# Patient Record
Sex: Male | Born: 1980 | Race: White | Hispanic: No | Marital: Single | Smoking: Never smoker
Health system: Southern US, Community
[De-identification: ages and names within clinical notes are randomized; demographics above are authoritative.]

## PROBLEM LIST (undated history)

## (undated) DIAGNOSIS — Z22322 Carrier or suspected carrier of Methicillin resistant Staphylococcus aureus: Secondary | ICD-10-CM

## (undated) DIAGNOSIS — J45909 Unspecified asthma, uncomplicated: Secondary | ICD-10-CM

## (undated) HISTORY — DX: Carrier or suspected carrier of methicillin resistant Staphylococcus aureus: Z22.322

## (undated) HISTORY — DX: Unspecified asthma, uncomplicated: J45.909

---
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
## 2004-09-14 ENCOUNTER — Emergency Department: Payer: Self-pay | Admitting: Emergency Medicine

## 2007-09-15 ENCOUNTER — Emergency Department: Payer: Self-pay | Admitting: Emergency Medicine

## 2008-11-21 IMAGING — CR LEFT THUMB 2+V
1 series · 3 of 3 positions shown · non-contrast
Comparison: none

REASON FOR EXAM: laceration, handsaw, pain
COMMENTS:   LMP: male

PROCEDURE:     DXR - DXR THUMB LEFT HAND (1ST DIGIT)  - September 15, 2007  [DATE]
RESULT:     Three views of the LEFT thumb revealed no evidence of acute
fracture. Disruption of soft tissues near the nail base is suspected. No
radiopaque foreign bodies are seen.

[Series 1: view not recorded · 0.17mm/px · 3 of 3 slices shown]
[im 1/3]
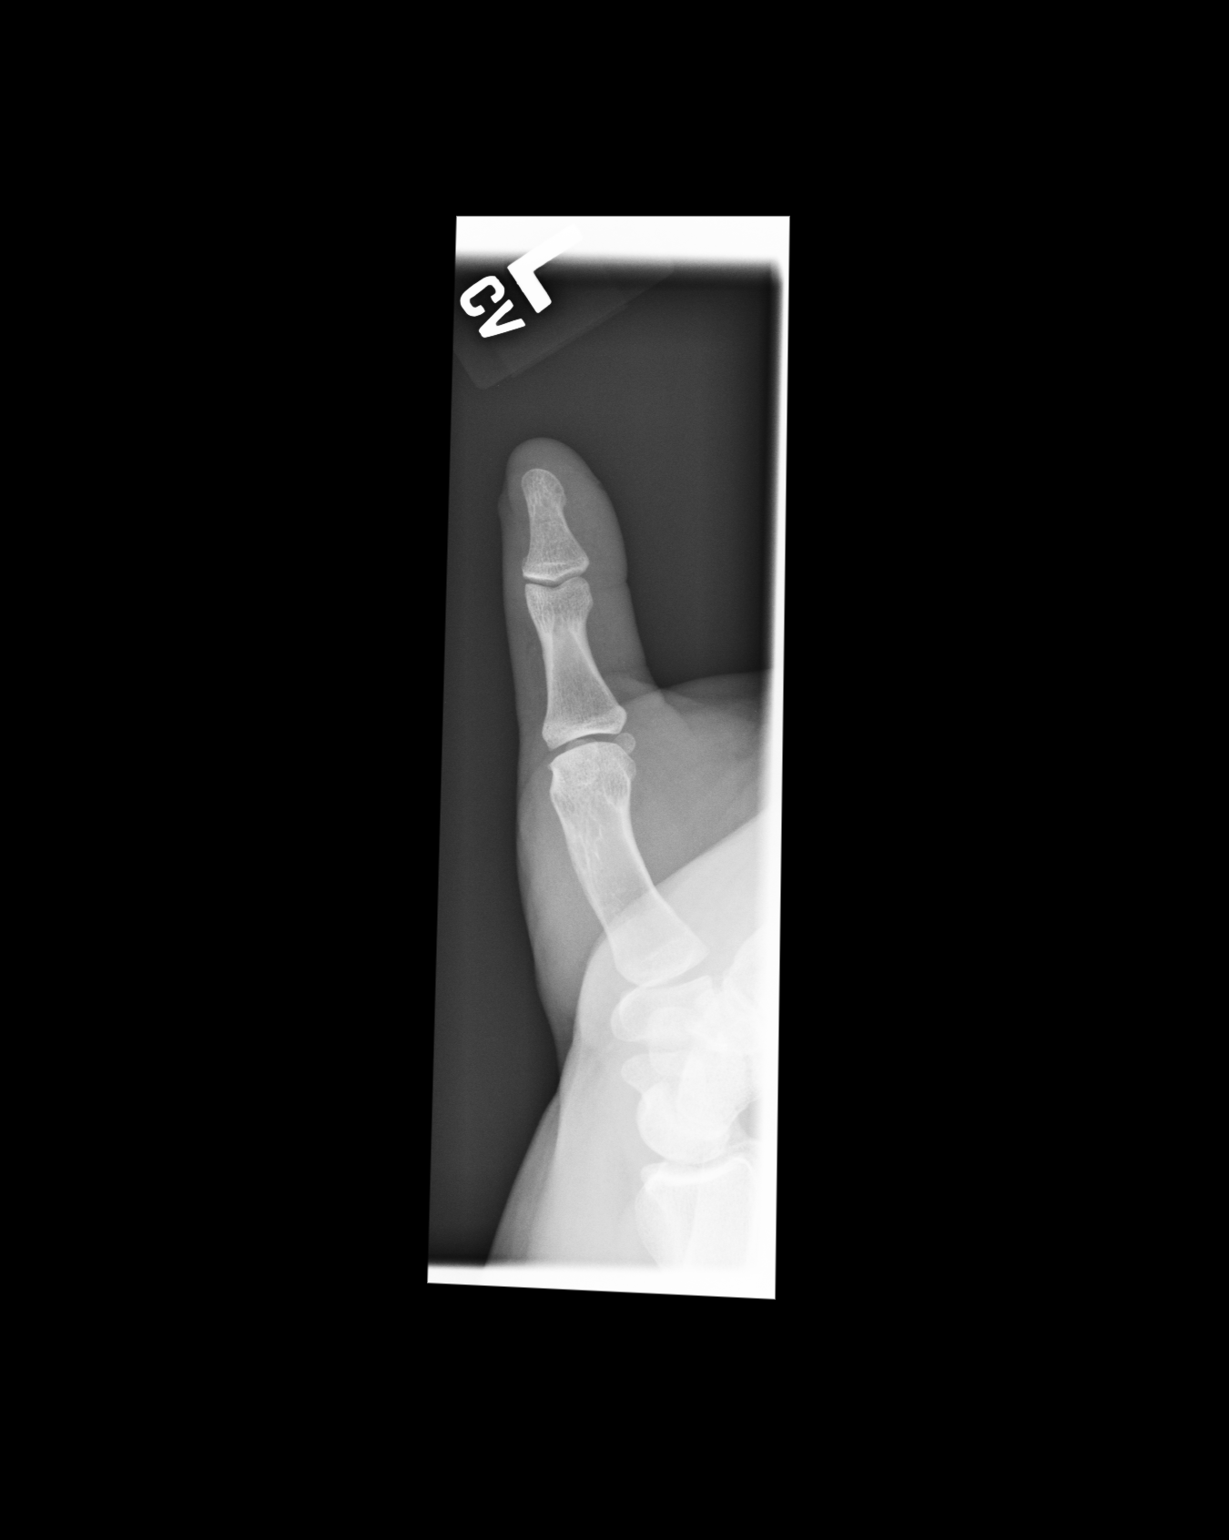
[im 2/3]
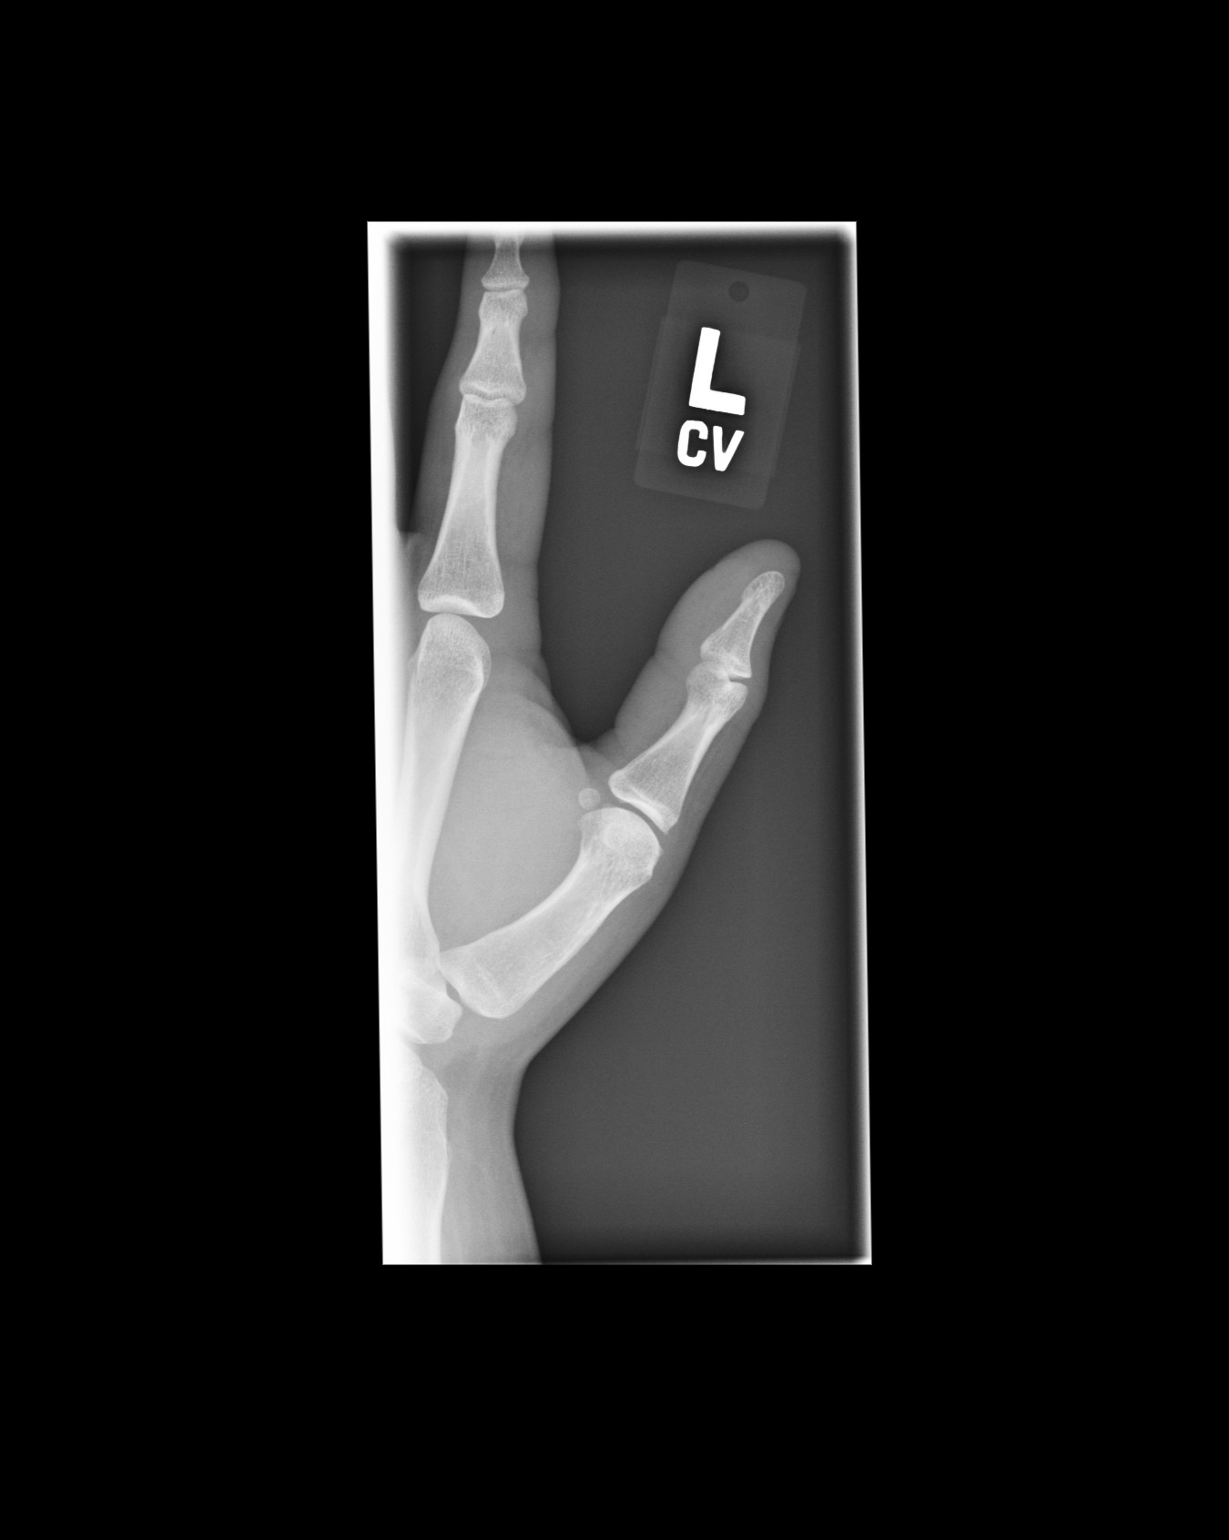
[im 3/3]
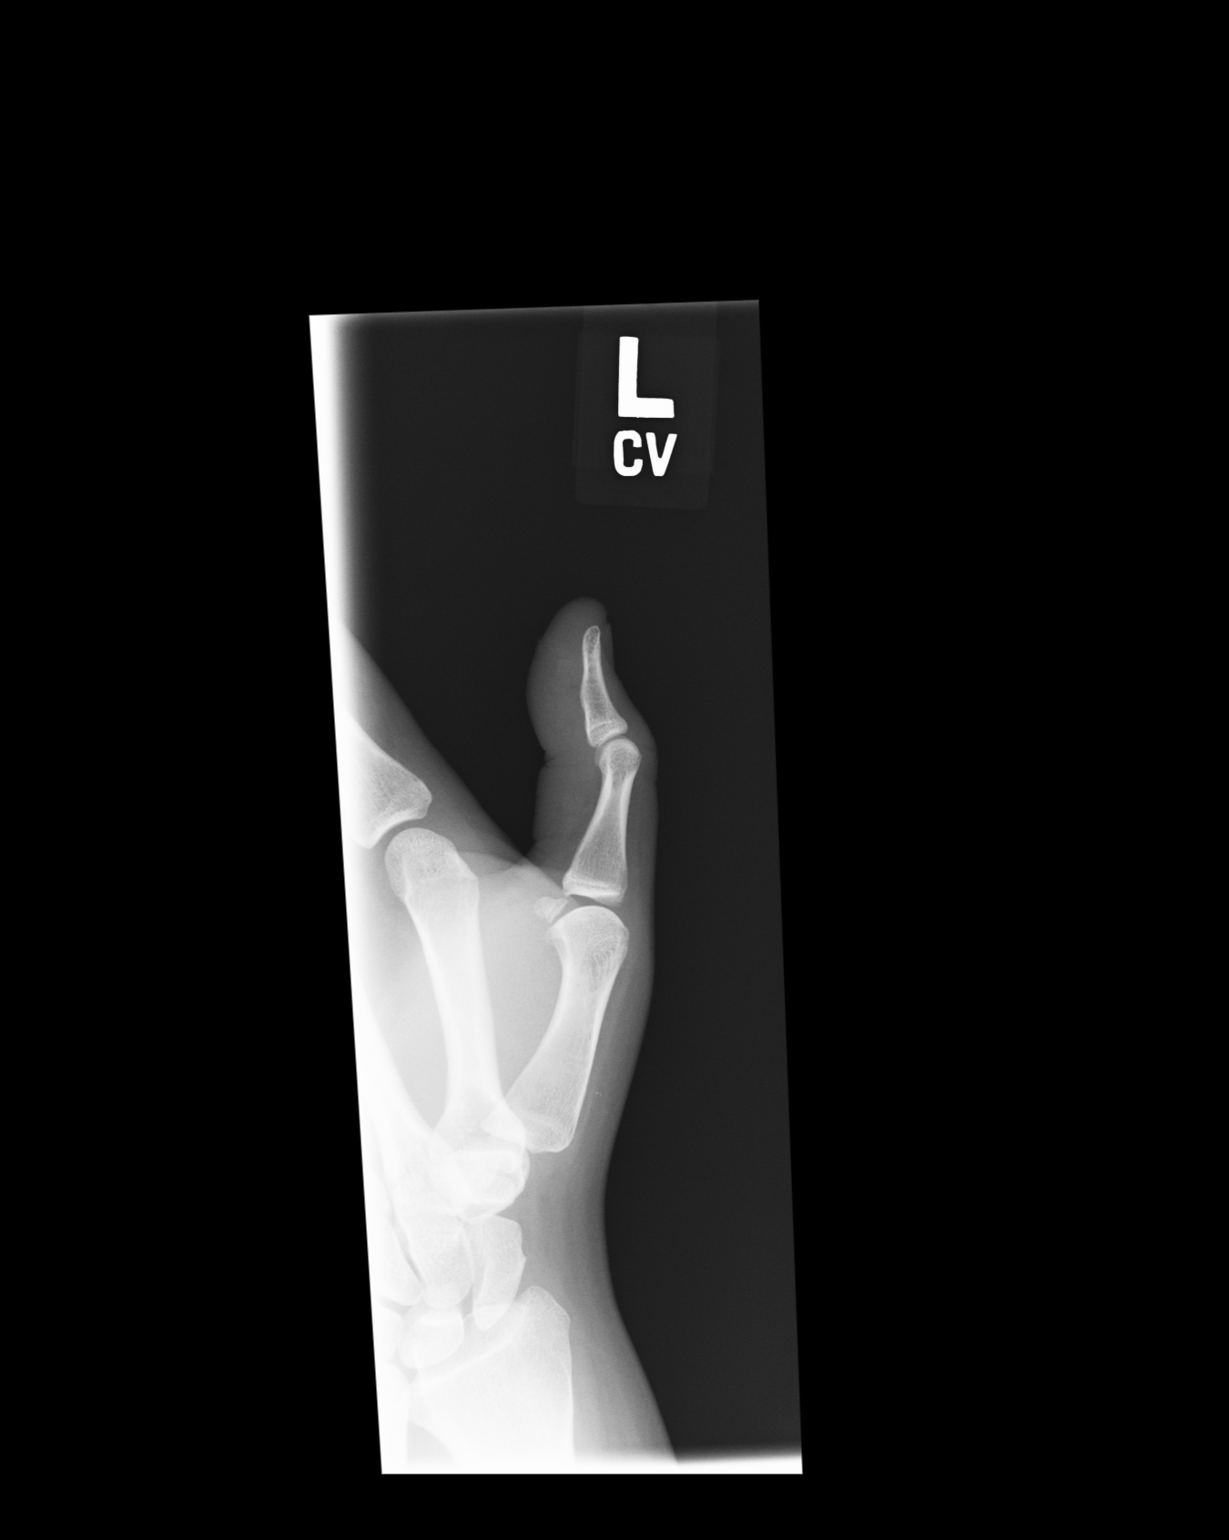

[3 of 3 positions shown; findings below may reference images not displayed]

IMPRESSION: I do not see acute abnormality of the bones of the thumb.
Soft tissue disruption is suspected.

## 2012-08-16 ENCOUNTER — Emergency Department: Payer: Self-pay | Admitting: Emergency Medicine

## 2017-08-29 DIAGNOSIS — J45909 Unspecified asthma, uncomplicated: Secondary | ICD-10-CM | POA: Diagnosis not present

## 2017-12-15 DIAGNOSIS — S93409A Sprain of unspecified ligament of unspecified ankle, initial encounter: Secondary | ICD-10-CM | POA: Diagnosis not present

## 2017-12-15 DIAGNOSIS — S96919A Strain of unspecified muscle and tendon at ankle and foot level, unspecified foot, initial encounter: Secondary | ICD-10-CM | POA: Diagnosis not present

## 2018-04-07 DIAGNOSIS — R2241 Localized swelling, mass and lump, right lower limb: Secondary | ICD-10-CM | POA: Diagnosis not present

## 2018-04-07 DIAGNOSIS — L03115 Cellulitis of right lower limb: Secondary | ICD-10-CM | POA: Diagnosis not present

## 2018-04-20 ENCOUNTER — Encounter: Payer: Self-pay | Admitting: Internal Medicine

## 2018-04-23 ENCOUNTER — Ambulatory Visit: Payer: Self-pay | Admitting: Internal Medicine

## 2018-05-20 ENCOUNTER — Ambulatory Visit: Payer: Self-pay

## 2018-05-20 ENCOUNTER — Ambulatory Visit
Admission: RE | Admit: 2018-05-20 | Discharge: 2018-05-20 | Disposition: A | Discharge status: Home / Self Care | Payer: 59 | Source: Ambulatory Visit | Attending: Internal Medicine | Admitting: Internal Medicine

## 2018-05-20 ENCOUNTER — Ambulatory Visit: Payer: 59 | Admitting: Internal Medicine

## 2018-05-20 ENCOUNTER — Encounter: Payer: Self-pay | Admitting: Internal Medicine

## 2018-05-20 VITALS — BP 132/70 | HR 61 | Ht 70.0 in | Wt 287.0 lb

## 2018-05-20 DIAGNOSIS — M25561 Pain in right knee: Secondary | ICD-10-CM | POA: Insufficient documentation

## 2018-05-20 DIAGNOSIS — J4520 Mild intermittent asthma, uncomplicated: Secondary | ICD-10-CM | POA: Insufficient documentation

## 2018-05-20 NOTE — Progress Notes (Signed)
    Date:  05/20/2018   Name:  Philip Barrett   DOB:  1981-02-15   MRN:  [id number]   Chief Complaint: Establish Care and Knee Pain (Right knee- 2 months ago. Hit it and swelled up. UC did not do XR but still having problems with pain. Had gout in L ankle in the past. Swelling is down but still painful. )  Knee Pain   The injury mechanism was a direct blow. The pain is present in the right knee. The pain is mild. Pertinent negatives include no numbness. He has tried NSAIDs (given nsaid by UC but no Xray about 6 weeks ago) for the symptoms. The treatment provided mild relief.  Asthma  There is no cough, shortness of breath or wheezing. This is a recurrent problem. The problem occurs rarely. Pertinent negatives include no chest pain, fever or headaches. His symptoms are not alleviated by beta-agonist. His past medical history is significant for asthma.    Review of Systems  Constitutional: Negative for chills, fatigue and fever.  HENT: Negative for congestion.   Respiratory: Negative for cough, chest tightness, shortness of breath and wheezing.   Cardiovascular: Negative for chest pain, palpitations and leg swelling.  Musculoskeletal: Positive for arthralgias and joint swelling.  Neurological: Negative for dizziness, weakness, numbness and headaches.    There are no active problems to display for this patient.   Allergies  Allergen Reactions  . Shellfish Allergy Anaphylaxis    History reviewed. No pertinent surgical history.  Social History   Tobacco Use  . Smoking status: Never Smoker  . Smokeless tobacco: Never Used  Substance Use Topics  . Alcohol use: Not Currently  . Drug use: Never     Medication list has been reviewed and updated.  No outpatient medications have been marked as taking for the 05/20/18 encounter (Office Visit) with Reubin Milan, MD.    [facility] 2/9 Scores 05/20/2018  PHQ - 2 Score 0    Physical Exam  Constitutional: He is oriented to person,  place, and time. He appears well-developed. No distress.  HENT:  Head: Normocephalic and atraumatic.  Eyes: Pupils are equal, round, and reactive to light.  Neck: Normal range of motion. Neck supple.  Cardiovascular: Normal rate, regular rhythm and normal heart sounds.  Pulmonary/Chest: Effort normal and breath sounds normal. No respiratory distress.  Musculoskeletal: Normal range of motion.  Neurological: He is alert and oriented to person, place, and time.  Skin: Skin is warm and dry. No rash noted.  Psychiatric: He has a normal mood and affect. His behavior is normal. Thought content normal.  Nursing note and vitals reviewed.   BP 132/70 (BP Location: Right Arm, Patient Position: Sitting, Cuff Size: Normal)   Pulse 61   Ht 5\' 10"  (1.778 m)   Wt 287 lb (130.2 kg)   SpO2 96%   BMI 41.18 kg/m   Assessment and Plan: 1. Acute pain of right knee Take Aleve bid prn - DG Knee Complete 4 Views Right; Future  2. Mild intermittent asthma without complication Continue albuterol mdi prn   Partially dictated using Animal nutritionist. Any errors are unintentional.  Bari Edward, MD [facility]  05/20/2018

## 2018-05-20 NOTE — Patient Instructions (Signed)
Begin Aleve 220 mg 1-2 every morning and can repeat in the evening if needed.

## 2024-03-09 ENCOUNTER — Ambulatory Visit: Admitting: Internal Medicine

## 2024-03-18 ENCOUNTER — Ambulatory Visit: Admitting: Family Medicine

## 2024-04-01 ENCOUNTER — Ambulatory Visit: Admitting: Family Medicine
